# Patient Record
Sex: Male | Born: 2000 | Race: White | Hispanic: No | Marital: Single | Smoking: Never smoker
Health system: Southern US, Community
[De-identification: ages and names within clinical notes are randomized; demographics above are authoritative.]

## PROBLEM LIST (undated history)

## (undated) DIAGNOSIS — R079 Chest pain, unspecified: Secondary | ICD-10-CM

## (undated) DIAGNOSIS — F419 Anxiety disorder, unspecified: Secondary | ICD-10-CM

## (undated) DIAGNOSIS — F32A Depression, unspecified: Secondary | ICD-10-CM

## (undated) DIAGNOSIS — F329 Major depressive disorder, single episode, unspecified: Secondary | ICD-10-CM

## (undated) DIAGNOSIS — Q244 Congenital subaortic stenosis: Secondary | ICD-10-CM

## (undated) HISTORY — DX: Chest pain, unspecified: R07.9

---
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
## 2019-07-26 ENCOUNTER — Ambulatory Visit
Admission: EM | Admit: 2019-07-26 | Discharge: 2019-07-26 | Disposition: A | Discharge status: Home / Self Care | Payer: Medicaid Other

## 2019-07-26 ENCOUNTER — Other Ambulatory Visit: Payer: Self-pay

## 2019-07-26 DIAGNOSIS — R310 Gross hematuria: Secondary | ICD-10-CM | POA: Diagnosis not present

## 2019-07-26 DIAGNOSIS — R109 Unspecified abdominal pain: Secondary | ICD-10-CM

## 2019-07-26 LAB — POCT URINALYSIS DIP (MANUAL ENTRY)
Glucose, UA: NEGATIVE mg/dL
Ketones, POC UA: NEGATIVE mg/dL
Leukocytes, UA: NEGATIVE
Nitrite, UA: NEGATIVE
Protein Ur, POC: 100 mg/dL — AB
Spec Grav, UA: >=1.03 — AB (ref 1.010–1.025)
Urobilinogen, UA: 0.2 E.U./dL
pH, UA: 5.5 (ref 5.0–8.0)

## 2019-07-26 MED ORDER — TAMSULOSIN HCL 0.4 MG PO CAPS: 0.4000 mg | ORAL_CAPSULE | Freq: Every day | ORAL | 0 refills | Status: DC

## 2019-07-26 MED ORDER — KETOROLAC TROMETHAMINE 10 MG PO TABS: 10.0000 mg | ORAL_TABLET | Freq: Four times a day (QID) | ORAL | 0 refills | Status: DC | PRN

## 2019-07-26 NOTE — ED Triage Notes (Signed)
Patient presents with right side pain that started this morning when he woke up.  He noticed some blood in his urine when he went to the bathroom. He denies pain with voiding, but does have a feeling of urgency.

## 2019-07-26 NOTE — Discharge Instructions (Addendum)
Urine negative for infection. Your history and exam is consistent with a kidney stone. Start flomax as directed. Toradol as needed for pain. Keep hydrated, urine should be clear to pale yellow in color. Follow up with PCP/urology if symptoms does not improve/resolve. If having vomiting, fever, unable to urinate, go to the ED for further evaluation needed.

## 2019-07-26 NOTE — ED Provider Notes (Signed)
[facility]    CSN: [id number] Arrival date & time: 07/26/19  1312      History   Chief Complaint Chief Complaint  Patient presents with  . Hematuria    HPI Willie Tucker is a 19 y.o. male.   19 year old male comes in for 1 day history of right flank/RLQ pain, hematuria.  Right flank/RLQ pain is intermittent, radiating to the groin, not specifically associated with urination.  Denies nausea, vomiting, diarrhea.  Denies fever, chills.  Denies dysuria, urinary frequency, urinary urgency.  This noticed slight urinary hesitancy/dribbling.  Denies penile discharge, penile lesion, testicular swelling, testicular pain. Has never been sexually active.      Past Medical History:  Diagnosis Date  . Anxiety   . Depression     There are no problems to display for this patient.   History reviewed. No pertinent surgical history.     Home Medications    Prior to Admission medications   Medication Sig Start Date End Date Taking? Authorizing Provider  ARIPiprazole (ABILIFY) 5 MG tablet Take 5 mg by mouth daily. 06/04/19   [provider]  DULoxetine (CYMBALTA) 60 MG capsule Take 60 mg by mouth daily.    [provider]  ketorolac (TORADOL) 10 MG tablet Take 1 tablet (10 mg total) by mouth every 6 (six) hours as needed. 07/26/19   Cathie Hoops, Taleeya Blondin V, PA-C  tamsulosin (FLOMAX) 0.4 MG CAPS capsule Take 1 capsule (0.4 mg total) by mouth daily. 07/26/19   Belinda Fisher, PA-C    Family History Family History  Problem Relation Age of Onset  . Kidney Stones Mother   . Kidney Stones Father     Social History Social History   Tobacco Use  . Smoking status: Never Smoker  . Smokeless tobacco: Never Used  Substance Use Topics  . Alcohol use: Never  . Drug use: Never     Allergies   Patient has no known allergies.   Review of Systems Review of Systems  Reason unable to perform ROS: See HPI as above.     Physical Exam Triage Vital Signs ED Triage  Vitals  Enc Vitals Group     BP 07/26/19 1322 136/87     Pulse Rate 07/26/19 1322 95     Resp 07/26/19 1322 14     Temp 07/26/19 1322 98.5 F (36.9 C)     Temp Source 07/26/19 1322 Oral     SpO2 07/26/19 1322 98 %     Weight --      Height --      Head Circumference --      Peak Flow --      Pain Score 07/26/19 1324 5     Pain Loc --      Pain Edu? --      Excl. in [place]? --    No data found.  Updated Vital Signs BP 136/87 (BP Location: Left Arm)   Pulse 95   Temp 98.5 F (36.9 C) (Oral)   Resp 14   SpO2 98%   Physical Exam Constitutional:      General: He is not in acute distress.    Appearance: Normal appearance. He is not ill-appearing, toxic-appearing or diaphoretic.  HENT:     Head: Normocephalic and atraumatic.  Cardiovascular:     Rate and Rhythm: Normal rate and regular rhythm.  Pulmonary:     Effort: Pulmonary effort is normal. No respiratory distress.     Comments: LCTAB Abdominal:  General: Bowel sounds are normal.     Palpations: Abdomen is soft.     Tenderness: There is no abdominal tenderness. There is right CVA tenderness. There is no left CVA tenderness, guarding or rebound.  Musculoskeletal:     Cervical back: Normal range of motion and neck supple.  Skin:    General: Skin is warm and dry.  Neurological:     Mental Status: He is alert and oriented to person, place, and time.      UC Treatments / Results  Labs (all labs ordered are listed, but only abnormal results are displayed) Labs Reviewed  POCT URINALYSIS DIP (MANUAL ENTRY) - Abnormal; Notable for the following components:      Result Value   Color, UA red (*)    Clarity, UA cloudy (*)    Bilirubin, UA small (*)    Spec Grav, UA >=1.030 (*)    Blood, UA large (*)    Protein Ur, POC =100 (*)    All other components within normal limits    EKG   Radiology No results found.  Procedures Procedures (including critical care time)  Medications Ordered in UC Medications - No  data to display  Initial Impression / Assessment and Plan / UC Course  I have reviewed the triage vital signs and the nursing notes.  Pertinent labs & imaging results that were available during my care of the patient were reviewed by me and considered in my medical decision making (see chart for details).    Urine dipstick negative for infection.  Positive for large blood, combined with current history and exam consistent with urethral stone.  Patient without significant pain, still able to urinate.  Will treat with Flomax, Toradol.  Patient declined Toradol IM in office at this time.  Push fluids.  Return precautions given.  Patient expresses understanding and agrees to plan.  Final Clinical Impressions(s) / UC Diagnoses   Final diagnoses:  Gross hematuria  Right flank pain    ED Prescriptions    Medication Sig Dispense Auth. Provider   ketorolac (TORADOL) 10 MG tablet Take 1 tablet (10 mg total) by mouth every 6 (six) hours as needed. 20 tablet Garen Woolbright V, PA-C   tamsulosin (FLOMAX) 0.4 MG CAPS capsule Take 1 capsule (0.4 mg total) by mouth daily. 10 capsule Ok Edwards, PA-C     PDMP not reviewed this encounter.   Ok Edwards, PA-C 07/26/19 1349

## 2020-01-17 ENCOUNTER — Ambulatory Visit
Admission: EM | Admit: 2020-01-17 | Discharge: 2020-01-17 | Disposition: A | Discharge status: Home / Self Care | Payer: Medicaid Other | Attending: Physician Assistant | Admitting: Physician Assistant

## 2020-01-17 ENCOUNTER — Other Ambulatory Visit: Payer: Self-pay

## 2020-01-17 DIAGNOSIS — R109 Unspecified abdominal pain: Secondary | ICD-10-CM

## 2020-01-17 DIAGNOSIS — R3129 Other microscopic hematuria: Secondary | ICD-10-CM

## 2020-01-17 LAB — POCT URINALYSIS DIP (MANUAL ENTRY)
Glucose, UA: NEGATIVE mg/dL
Leukocytes, UA: NEGATIVE
Nitrite, UA: NEGATIVE
Spec Grav, UA: 1.025 (ref 1.010–1.025)
Urobilinogen, UA: 1 E.U./dL
pH, UA: 6 (ref 5.0–8.0)

## 2020-01-17 MED ORDER — TIZANIDINE HCL 2 MG PO TABS: 2.0000 mg | ORAL_TABLET | Freq: Three times a day (TID) | ORAL | 0 refills | Status: AC | PRN

## 2020-01-17 MED ORDER — TAMSULOSIN HCL 0.4 MG PO CAPS: 0.4000 mg | ORAL_CAPSULE | Freq: Every day | ORAL | 0 refills | Status: AC

## 2020-01-17 MED ORDER — KETOROLAC TROMETHAMINE 10 MG PO TABS: 10.0000 mg | ORAL_TABLET | Freq: Four times a day (QID) | ORAL | 0 refills | Status: AC | PRN

## 2020-01-17 NOTE — Discharge Instructions (Addendum)
As discussed, there is blood in your urine.  This could be another stone passing through, however, there may also be some muscle involvement.  Start Flomax as directed.  Toradol for pain.  Tizanidine as needed for any muscle pain, this can make you drowsy, so do not take if you are going to drive, operate heavy machinery, or make important decisions. Ice/heat compresses as needed. Follow up with PCP/urology for reevaluation. If unable to urinate, fever, go to the emergency department for further evaluation.

## 2020-01-17 NOTE — ED Triage Notes (Signed)
Patient presents to Urgent Care with complaints of lower left flank pain started a few days ago. Taken Tylenol for pain last dose yesterday.   Denies fever, chills, N/V, hematuria, or abdominal pain.

## 2020-01-17 NOTE — ED Provider Notes (Signed)
[facility]    CSN: [id number] Arrival date & time: 01/17/20  4174      History   Chief Complaint No chief complaint on file.   HPI Willie Tucker is a 19 y.o. male.   19 year old male comes in for left flank pain. Some association with movement. Denies radiation of pain. Denies urinary changes. Denies nausea/vomiting. Denies fever, abdominal pain. Tylenol with some relief.      Past Medical History:  Diagnosis Date  . Anxiety   . Depression     There are no problems to display for this patient.   History reviewed. No pertinent surgical history.     Home Medications    Prior to Admission medications   Medication Sig Start Date End Date Taking? Authorizing Provider  ARIPiprazole (ABILIFY) 5 MG tablet Take 5 mg by mouth daily. 06/04/19   [provider]  DULoxetine (CYMBALTA) 60 MG capsule Take 60 mg by mouth daily.    [provider]  ketorolac (TORADOL) 10 MG tablet Take 1 tablet (10 mg total) by mouth every 6 (six) hours as needed. 01/17/20   Cathie Hoops, Rusell Meneely V, PA-C  tamsulosin (FLOMAX) 0.4 MG CAPS capsule Take 1 capsule (0.4 mg total) by mouth daily. 01/17/20   Cathie Hoops, Rashauna Tep V, PA-C  tiZANidine (ZANAFLEX) 2 MG tablet Take 1 tablet (2 mg total) by mouth every 8 (eight) hours as needed for muscle spasms. 01/17/20   Belinda Fisher, PA-C    Family History Family History  Problem Relation Age of Onset  . Kidney Stones Mother   . Kidney Stones Father     Social History Social History   Tobacco Use  . Smoking status: Never Smoker  . Smokeless tobacco: Never Used  Substance Use Topics  . Alcohol use: Never  . Drug use: Never     Allergies   Patient has no known allergies.   Review of Systems Review of Systems  Reason unable to perform ROS: See HPI as above.     Physical Exam Triage Vital Signs ED Triage Vitals  Enc Vitals Group     BP 01/17/20 1049 (!) 130/94     Pulse Rate 01/17/20 1049 88     Resp 01/17/20 1049 16     Temp  01/17/20 1049 99 F (37.2 C)     Temp Source 01/17/20 1049 Oral     SpO2 01/17/20 1049 96 %     Weight --      Height --      Head Circumference --      Peak Flow --      Pain Score 01/17/20 1048 5     Pain Loc --      Pain Edu? --      Excl. in [place]? --    No data found.  Updated Vital Signs BP (!) 130/94 (BP Location: Left Arm)   Pulse 88   Temp 99 F (37.2 C) (Oral)   Resp 16   SpO2 96%   Physical Exam Constitutional:      General: He is not in acute distress.    Appearance: He is well-developed. He is not diaphoretic.  HENT:     Head: Normocephalic and atraumatic.  Eyes:     Conjunctiva/sclera: Conjunctivae normal.     Pupils: Pupils are equal, round, and reactive to light.  Cardiovascular:     Rate and Rhythm: Normal rate and regular rhythm.     Heart sounds: Normal heart sounds.  No murmur heard.  No friction rub. No gallop.   Pulmonary:     Effort: Pulmonary effort is normal. No accessory muscle usage or respiratory distress.     Breath sounds: Normal breath sounds. No stridor. No decreased breath sounds, wheezing, rhonchi or rales.  Musculoskeletal:     Comments: No tenderness on palpation of the spinous processes. Tenderness to palpation of left flank. Full range of motion of back/hips. Negative straight leg raise. Negative CVA tenderness to palpation.   Skin:    General: Skin is warm and dry.  Neurological:     Mental Status: He is alert and oriented to person, place, and time.      UC Treatments / Results  Labs (all labs ordered are listed, but only abnormal results are displayed) Labs Reviewed  POCT URINALYSIS DIP (MANUAL ENTRY) - Abnormal; Notable for the following components:      Result Value   Bilirubin, UA small (*)    Ketones, POC UA trace (5) (*)    Blood, UA moderate (*)    Protein Ur, POC trace (*)    All other components within normal limits    EKG   Radiology No results found.  Procedures Procedures (including critical care  time)  Medications Ordered in UC Medications - No data to display  Initial Impression / Assessment and Plan / UC Course  I have reviewed the triage vital signs and the nursing notes.  Pertinent labs & imaging results that were available during my care of the patient were reviewed by me and considered in my medical decision making (see chart for details).    ? MSK pain vs urethral stone/renal colic. Given blood on dipstick, will start flomax as directed. Toradol/tizanidine as needed. Patient with gross hematuria last visit from possible urethral stone, no dipstick post symptoms. Discussed to follow up with PCP for dipstick once asymptomatic to ensure resolution of blood. Return precautions given.   Final Clinical Impressions(s) / UC Diagnoses   Final diagnoses:  Left flank pain  Other microscopic hematuria    ED Prescriptions    Medication Sig Dispense Auth. Provider   tamsulosin (FLOMAX) 0.4 MG CAPS capsule Take 1 capsule (0.4 mg total) by mouth daily. 10 capsule Cathie Hoops, Anh Mangano V, PA-C   ketorolac (TORADOL) 10 MG tablet Take 1 tablet (10 mg total) by mouth every 6 (six) hours as needed. 20 tablet Madelaine Whipple V, PA-C   tiZANidine (ZANAFLEX) 2 MG tablet Take 1 tablet (2 mg total) by mouth every 8 (eight) hours as needed for muscle spasms. 15 tablet Belinda Fisher, PA-C     PDMP not reviewed this encounter.   Belinda Fisher, PA-C 01/17/20 1555

## 2021-07-25 ENCOUNTER — Encounter (HOSPITAL_COMMUNITY): Payer: Self-pay

## 2021-07-25 ENCOUNTER — Other Ambulatory Visit: Payer: Self-pay

## 2021-07-25 ENCOUNTER — Emergency Department (HOSPITAL_COMMUNITY)
Admission: EM | Admit: 2021-07-25 | Discharge: 2021-07-25 | Disposition: A | Discharge status: Home / Self Care | Payer: Medicaid Other | Attending: Emergency Medicine | Admitting: Emergency Medicine

## 2021-07-25 ENCOUNTER — Emergency Department (HOSPITAL_COMMUNITY): Payer: Medicaid Other

## 2021-07-25 DIAGNOSIS — R0789 Other chest pain: Secondary | ICD-10-CM | POA: Insufficient documentation

## 2021-07-25 HISTORY — DX: Congenital subaortic stenosis: Q24.4

## 2021-07-25 LAB — BASIC METABOLIC PANEL
Anion gap: 6 (ref 5–15)
BUN: 12 mg/dL (ref 6–20)
CO2: 27 mmol/L (ref 22–32)
Calcium: 9.5 mg/dL (ref 8.9–10.3)
Chloride: 106 mmol/L (ref 98–111)
Creatinine, Ser: 0.78 mg/dL (ref 0.61–1.24)
GFR, Estimated: >60 mL/min (ref >60)
Glucose, Bld: 90 mg/dL (ref 70–99)
Potassium: 4.2 mmol/L (ref 3.5–5.1)
Sodium: 139 mmol/L (ref 135–145)

## 2021-07-25 LAB — CBC
HCT: 50.7 % (ref 39.0–52.0)
Hemoglobin: 17.8 g/dL — ABNORMAL HIGH (ref 13.0–17.0)
MCH: 32.5 pg (ref 26.0–34.0)
MCHC: 35.1 g/dL (ref 30.0–36.0)
MCV: 92.5 fL (ref 80.0–100.0)
Platelets: 256 10*3/uL (ref 150–400)
RBC: 5.48 MIL/uL (ref 4.22–5.81)
RDW: 11.9 % (ref 11.5–15.5)
WBC: 5.4 10*3/uL (ref 4.0–10.5)
nRBC: 0 % (ref 0.0–0.2)

## 2021-07-25 LAB — TROPONIN I (HIGH SENSITIVITY): Troponin I (High Sensitivity): <2 ng/L (ref <18)

## 2021-07-25 MED ORDER — IBUPROFEN 800 MG PO TABS: 800.0000 mg | ORAL_TABLET | Freq: Three times a day (TID) | ORAL | 0 refills | Status: AC | PRN

## 2021-07-25 NOTE — ED Provider Notes (Signed)
?[facility]-EMERGENCY DEPT ?Provider Note ? ? ?CSN: [id number] ?Arrival date & time: 07/25/21  3419 ? ?  ? ?History ? ?Chief Complaint  ?Patient presents with  ? Chest Pain  ? ? ?Willie Tucker is a 21 y.o. male. ? ?Patient complains of left-sided chest pain when he moves his head to the right or he moves his left arm.  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  He was supposed to be followed up ? ?The history is provided by the patient and medical records. No language interpreter was used.  ?Chest Pain ?Pain location:  L chest ?Pain quality: aching   ?Pain radiates to:  Does not radiate ?Pain severity:  Mild ?Onset quality:  Sudden ?Timing:  Constant ?Progression:  Waxing and waning ?Chronicity:  New ?Context: movement   ?Context: not breathing   ?Relieved by:  Nothing ?Worsened by:  Nothing ?Ineffective treatments:  None tried ?Associated symptoms: no abdominal pain, no back pain, no cough, no fatigue and no headache   ? ?  ? ?Home Medications ?Prior to Admission medications   ?Medication Sig Start Date End Date Taking? Authorizing Provider  ?ibuprofen (ADVIL) 800 MG tablet Take 1 tablet (800 mg total) by mouth every 8 (eight) hours as needed for moderate pain. 07/25/21  Yes Bethann Berkshire, MD  ?ARIPiprazole (ABILIFY) 5 MG tablet Take 5 mg by mouth daily. 06/04/19   [provider]  ?DULoxetine (CYMBALTA) 60 MG capsule Take 60 mg by mouth daily.    [provider]  ?ketorolac (TORADOL) 10 MG tablet Take 1 tablet (10 mg total) by mouth every 6 (six) hours as needed. 01/17/20   Cathie Hoops, Amy V, PA-C  ?tamsulosin (FLOMAX) 0.4 MG CAPS capsule Take 1 capsule (0.4 mg total) by mouth daily. 01/17/20   Cathie Hoops, Amy V, PA-C  ?tiZANidine (ZANAFLEX) 2 MG tablet Take 1 tablet (2 mg total) by mouth every 8 (eight) hours as needed for muscle spasms. 01/17/20   Belinda Fisher, PA-C  ?   ? ?Allergies    ?Patient has no known allergies.   ? ?Review of Systems    ?Review of Systems  ?Constitutional:  Negative for appetite change and fatigue.  ?HENT:  Negative for congestion, ear discharge and sinus pressure.   ?Eyes:  Negative for discharge.  ?Respiratory:  Negative for cough.   ?Cardiovascular:  Positive for chest pain.  ?Gastrointestinal:  Negative for abdominal pain and diarrhea.  ?Genitourinary:  Negative for frequency and hematuria.  ?Musculoskeletal:  Negative for back pain.  ?Skin:  Negative for rash.  ?Neurological:  Negative for seizures and headaches.  ?Psychiatric/Behavioral:  Negative for hallucinations.   ? ?Physical Exam ?Updated Vital Signs ?BP 118/72   Pulse 83   Temp 98.1 ?F (36.7 ?C) (Oral)   Resp 20   Ht 5\' 10"  (1.778 m)   Wt 86.2 kg   SpO2 96%   BMI 27.26 kg/m?  ?Physical Exam ?Vitals and nursing note reviewed.  ?Constitutional:   ?   Appearance: He is well-developed.  ?HENT:  ?   Head: Normocephalic.  ?   Nose: Nose normal.  ?Eyes:  ?   General: No scleral icterus. ?   Conjunctiva/sclera: Conjunctivae normal.  ?Neck:  ?   Thyroid: No thyromegaly.  ?Cardiovascular:  ?   Rate and Rhythm: Normal rate and regular rhythm.  ?   Heart sounds: No murmur heard. ?  No friction rub. No gallop.  ?  Pulmonary:  ?   Breath sounds: No stridor. No wheezing or rales.  ?Chest:  ?   Chest wall: Tenderness present.  ?Abdominal:  ?   General: There is no distension.  ?   Tenderness: There is no abdominal tenderness. There is no rebound.  ?Musculoskeletal:     ?   General: Normal range of motion.  ?   Cervical back: Neck supple.  ?Lymphadenopathy:  ?   Cervical: No cervical adenopathy.  ?Skin: ?   Findings: No erythema or rash.  ?Neurological:  ?   Mental Status: He is alert and oriented to person, place, and time.  ?   Motor: No abnormal muscle tone.  ?   Coordination: Coordination normal.  ?Psychiatric:     ?   Behavior: Behavior normal.  ? ? ?ED Results / Procedures / Treatments   ?Labs ?(all labs ordered are listed, but only abnormal results are displayed) ?Labs  Reviewed  ?CBC - Abnormal; Notable for the following components:  ?    Result Value  ? Hemoglobin 17.8 (*)   ? All other components within normal limits  ?BASIC METABOLIC PANEL  ?TROPONIN I (HIGH SENSITIVITY)  ?TROPONIN I (HIGH SENSITIVITY)  ? ? ?EKG ?EKG Interpretation ? ?Date/Time:  Tuesday Jul 25 2021 09:07:54 EDT ?Ventricular Rate:  89 ?PR Interval:  131 ?QRS Duration: 93 ?QT Interval:  356 ?QTC Calculation: 434 ?R Axis:   76 ?Text Interpretation: Sinus rhythm Confirmed by Bethann BerkshireZammit, Ravon Mortellaro [phone]([id number]) on 07/25/2021 9:15:30 AM ? ?Radiology ?DG Chest 2 View ? ?Result Date: 07/25/2021 ?CLINICAL DATA:  Chest pain. EXAM: CHEST - 2 VIEW COMPARISON:  Chest x-ray January 03, 2009 (without report). FINDINGS: No consolidation. No visible pleural effusions or pneumothorax. Cardiomediastinal silhouette is within normal limits. No displaced fracture. IMPRESSION: No evidence of acute cardiopulmonary disease. Electronically Signed   By: Feliberto HartsFrederick S Jones M.D.   On: 07/25/2021 09:33   ? ?Procedures ?Procedures  ? ? ?Medications Ordered in ED ?Medications - No data to display ? ?ED Course/ Medical Decision Making/ A&P ?Patient has atypical chest pain.  Doubt cardiac reason for pain. ?                        ?Medical Decision Making ?Amount and/or Complexity of Data Reviewed ?Labs: ordered. ?Radiology: ordered. ? ?Risk ?Prescription drug management. ? ?This patient presents to the ED for concern of chest pain, this involves an extensive number of treatment options, and is a complaint that carries with it a high risk of complications and morbidity.  The differential diagnosis includes musculoskeletal pain, cardiac pain, PE, pneumothorax ? ? ?Co morbidities that complicate the patient evaluation ? ?History of Sub aortic stenosis ? ? ?Additional history obtained: ? ?Additional history obtained from patient ?External records from outside source obtained and reviewed including hospital record ? ? ?Lab Tests: ? ?I Ordered, and personally  interpreted labs.  The pertinent results include: CBC chemistries troponin negative ? ? ?Imaging Studies ordered: ? ?I ordered imaging studies including chest X ?I independently visualized and interpreted imaging which showed unremarkable ?I agree with the radiologist interpretation ? ? ?Cardiac Monitoring: / EKG: ? ?The patient was maintained on a cardiac monitor.  I personally viewed and interpreted the cardiac monitored which showed an underlying rhythm of: Normal sinus rhythm ? ? ?Consultations Obtained: ? ?I requested consultation with the cardiology,  and discussed lab and imaging findings as well as pertinent plan - they recommend: Follow-up in the office ? ? ?  Problem List / ED Course / Critical interventions / Medication management ? ?Chest pain, subaortic stenosis ?I ordered medication including Motrin for pain ?Reevaluation of the patient after these medicines showed that the patient stayed the same ?I have reviewed the patients home medicines and have made adjustments as needed ? ? ?Social Determinants of Health: ? ?None ? ? ?Test / Admission - Considered: ? ?Echo ? ?Patient with atypical chest pain most likely related to musculoskeletal.  He will also follow-up with cardiology for his subaortic stenosis ? ? ? ? ? ? ? ?Final Clinical Impression(s) / ED Diagnoses ?Final diagnoses:  ?Atypical chest pain  ? ? ?Rx / DC Orders ?ED Discharge Orders   ? ?      Ordered  ?  ibuprofen (ADVIL) 800 MG tablet  Every 8 hours PRN       ? 07/25/21 1403  ? ?  ?  ? ?  ? ? ?  ?Bethann Berkshire, MD ?07/25/21 1642 ? ?

## 2021-07-25 NOTE — ED Triage Notes (Signed)
Patient states constant mid chest tightness since yesterday at 0700. Patient reports slight SOB. Patient denies N/V and diaphoresis. ?

## 2021-07-25 NOTE — Discharge Instructions (Signed)
Follow-up with your cardiologist when they get in contact you with you.  You can also follow-up with your family doctor if needed ?

## 2021-07-29 NOTE — Progress Notes (Unsigned)
Cardiology Office Note:    Date:  07/29/2021   ID:  Willie Tucker, DOB 2000/10/29, MRN [id number]  PCP:  Patient, No Pcp Per (Inactive)   [facility] Providers Cardiologist:  None {  Referring MD: No ref. provider found    History of Present Illness:    Willie Tucker is a 21 y.o. male with a hx of anxiety, depression and subaortic stenosis who was referred by Dr. Roderic Palau for further evaluation subaortic stenosis.  Patient was seen in the ER on 07/25/21 for atypical left sided chest pain that was worsened with certain positions of the arm. Trop negative. ECG nonischemic. CXR unremarkable. It was thought to be MSK in nature. Patient, however, was noted to have history of subaortic stenosis that has not been followed prompting referral to Cardiology today.  Today, ***  Past Medical History:  Diagnosis Date   Anxiety    Chest pain    Depression    Subaortic stenosis     No past surgical history on file.  Current Medications: No outpatient medications have been marked as taking for the 08/02/21 encounter (Appointment) with Freada Bergeron, MD.     Allergies:   Patient has no known allergies.   Social History   Socioeconomic History   Marital status: Single    Spouse name: Not on file   Number of children: Not on file   Years of education: Not on file   Highest education level: Not on file  Occupational History   Not on file  Tobacco Use   Smoking status: Never   Smokeless tobacco: Never  Vaping Use   Vaping Use: Never used  Substance and Sexual Activity   Alcohol use: Never   Drug use: Never   Sexual activity: Not on file  Other Topics Concern   Not on file  Social History Narrative   Not on file   Social Determinants of Health   Financial Resource Strain: Not on file  Food Insecurity: Not on file  Transportation Needs: Not on file  Physical Activity: Not on file  Stress: Not on file  Social Connections: Not on file     Family  History: The patient's ***family history includes Kidney Stones in his father and mother.  ROS:   Please see the history of present illness.    *** All other systems reviewed and are negative.  EKGs/Labs/Other Studies Reviewed:    The following studies were reviewed today: ***  EKG:  EKG is *** ordered today.  The ekg ordered today demonstrates ***  Recent Labs: 07/25/2021: BUN 12; Creatinine, Ser 0.78; Hemoglobin 17.8; Platelets 256; Potassium 4.2; Sodium 139  Recent Lipid Panel No results found for: CHOL, TRIG, HDL, CHOLHDL, VLDL, LDLCALC, LDLDIRECT   Risk Assessment/Calculations:   {Does this patient have ATRIAL FIBRILLATION?:[phone]}       Physical Exam:    VS:  There were no vitals taken for this visit.    Wt Readings from Last 3 Encounters:  07/25/21 190 lb (86.2 kg)     GEN: *** Well nourished, well developed in no acute distress HEENT: Normal NECK: No JVD; No carotid bruits LYMPHATICS: No lymphadenopathy CARDIAC: ***RRR, no murmurs, rubs, gallops RESPIRATORY:  Clear to auscultation without rales, wheezing or rhonchi  ABDOMEN: Soft, non-tender, non-distended MUSCULOSKELETAL:  No edema; No deformity  SKIN: Warm and dry NEUROLOGIC:  Alert and oriented x 3 PSYCHIATRIC:  Normal affect   ASSESSMENT:    No diagnosis found. PLAN:    In  order of problems listed above:  #History of Subaortic Stenosis: -Check TTE      {Are you ordering a CV Procedure (e.g. stress test, cath, DCCV, TEE, etc)?   Press F2        :[id number]    Medication Adjustments/Labs and Tests Ordered: Current medicines are reviewed at length with the patient today.  Concerns regarding medicines are outlined above.  No orders of the defined types were placed in this encounter.  No orders of the defined types were placed in this encounter.   There are no Patient Instructions on file for this visit.   Signed, Freada Bergeron, MD  07/29/2021 4:08 PM    [facility]

## 2021-08-02 ENCOUNTER — Ambulatory Visit: Payer: Medicaid Other | Admitting: Cardiology

## 2021-08-02 NOTE — Progress Notes (Incomplete)
Cardiology Office Note:    Date:  08/02/2021   ID:  Willie Tucker, DOB June 04, 2000, MRN [id number]  PCP:  Patient, No Pcp Per (Inactive)   [facility] Providers Cardiologist:  None {  Referring MD: No ref. provider found    History of Present Illness:    Willie Tucker is a 21 y.o. male with a hx of anxiety, depression and subaortic stenosis who was referred by Dr. Estell Harpin for further evaluation subaortic stenosis.  Patient was seen in the ER on 07/25/21 for atypical left sided chest pain that was worsened with certain positions of the arm. Trop negative. ECG nonischemic. CXR unremarkable. It was thought to be MSK in nature. Patient, however, was noted to have history of subaortic stenosis that has not been followed prompting referral to Cardiology today.  Today, the patient states that ***  He denies any palpitations, chest pain, shortness of breath, or peripheral edema. No lightheadedness, headaches, syncope, orthopnea, or PND.   Past Medical History:  Diagnosis Date   Anxiety    Chest pain    Depression    Subaortic stenosis     No past surgical history on file.  Current Medications: No outpatient medications have been marked as taking for the 08/02/21 encounter (Appointment) with Meriam Sprague, MD.     Allergies:   Patient has no known allergies.   Social History   Socioeconomic History   Marital status: Single    Spouse name: Not on file   Number of children: Not on file   Years of education: Not on file   Highest education level: Not on file  Occupational History   Not on file  Tobacco Use   Smoking status: Never   Smokeless tobacco: Never  Vaping Use   Vaping Use: Never used  Substance and Sexual Activity   Alcohol use: Never   Drug use: Never   Sexual activity: Not on file  Other Topics Concern   Not on file  Social History Narrative   Not on file   Social Determinants of Health   Financial Resource Strain: Not on file  Food  Insecurity: Not on file  Transportation Needs: Not on file  Physical Activity: Not on file  Stress: Not on file  Social Connections: Not on file     Family History: The patient's family history includes Kidney Stones in his father and mother.  ROS:   Review of Systems  Constitutional:  Negative for chills and fever.  HENT:  Negative for hearing loss and sinus pain.   Eyes:  Negative for photophobia.  Respiratory:  Negative for hemoptysis and sputum production.   Cardiovascular:  Negative for chest pain, palpitations, orthopnea, claudication, leg swelling and PND.  Gastrointestinal:  Negative for abdominal pain and constipation.  Genitourinary:  Negative for hematuria.  Musculoskeletal:  Negative for neck pain.  Neurological:  Negative for speech change and loss of consciousness.  Endo/Heme/Allergies:  Does not bruise/bleed easily.  Psychiatric/Behavioral:  Negative for hallucinations and suicidal ideas.     EKGs/Labs/Other Studies Reviewed:    The following studies were reviewed today:  No prior cardiovascular studies available.   EKG:  EKG is personally reviewed. 08/02/2021:  EKG was not ordered. 07/25/2021 (ED):  Sinus rhythm at 89 bpm.  Recent Labs: 07/25/2021: BUN 12; Creatinine, Ser 0.78; Hemoglobin 17.8; Platelets 256; Potassium 4.2; Sodium 139   Recent Lipid Panel No results found for: CHOL, TRIG, HDL, CHOLHDL, VLDL, LDLCALC, LDLDIRECT   Risk Assessment/Calculations:   {  Does this patient have ATRIAL FIBRILLATION?:[phone]}       Physical Exam:    VS:  There were no vitals taken for this visit.    Wt Readings from Last 3 Encounters:  07/25/21 190 lb (86.2 kg)     GEN: Well nourished, well developed in no acute distress HEENT: Normal NECK: No JVD; No carotid bruits LYMPHATICS: No lymphadenopathy CARDIAC: ***RRR, no murmurs, rubs, gallops RESPIRATORY:  Clear to auscultation without rales, wheezing or rhonchi  ABDOMEN: Soft, non-tender,  non-distended MUSCULOSKELETAL:  No edema; No deformity  SKIN: Warm and dry NEUROLOGIC:  Alert and oriented x 3 PSYCHIATRIC:  Normal affect   ASSESSMENT:    No diagnosis found. PLAN:    In order of problems listed above:  #History of Subaortic Stenosis: -Check TTE      {Are you ordering a CV Procedure (e.g. stress test, cath, DCCV, TEE, etc)?   Press F2        :[id number]}  Follow-up:  ***   Medication Adjustments/Labs and Tests Ordered: Current medicines are reviewed at length with the patient today.  Concerns regarding medicines are outlined above.   No orders of the defined types were placed in this encounter.  No orders of the defined types were placed in this encounter.  There are no Patient Instructions on file for this visit.   I,Mathew Stumpf,acting as a Neurosurgeon for Meriam Sprague, MD.,have documented all relevant documentation on the behalf of Meriam Sprague, MD,as directed by  Meriam Sprague, MD while in the presence of Meriam Sprague, MD.  ***  Signed, Carlena Bjornstad  08/02/2021 7:56 AM    [facility]

## 2021-09-22 ENCOUNTER — Encounter (HOSPITAL_COMMUNITY): Payer: Self-pay

## 2021-09-22 ENCOUNTER — Other Ambulatory Visit: Payer: Self-pay

## 2021-09-22 ENCOUNTER — Emergency Department (HOSPITAL_COMMUNITY)
Admission: EM | Admit: 2021-09-22 | Discharge: 2021-09-22 | Disposition: A | Discharge status: Home / Self Care | Payer: Medicaid Other | Attending: Emergency Medicine | Admitting: Emergency Medicine

## 2021-09-22 DIAGNOSIS — R42 Dizziness and giddiness: Secondary | ICD-10-CM | POA: Diagnosis not present

## 2021-09-22 DIAGNOSIS — R002 Palpitations: Secondary | ICD-10-CM | POA: Insufficient documentation

## 2021-09-22 DIAGNOSIS — R079 Chest pain, unspecified: Secondary | ICD-10-CM | POA: Insufficient documentation

## 2021-09-22 LAB — CBC WITH DIFFERENTIAL/PLATELET
Abs Immature Granulocytes: 0.01 10*3/uL (ref 0.00–0.07)
Basophils Absolute: 0 10*3/uL (ref 0.0–0.1)
Basophils Relative: 0 %
Eosinophils Absolute: 0 10*3/uL (ref 0.0–0.5)
Eosinophils Relative: 1 %
HCT: 46.7 % (ref 39.0–52.0)
Hemoglobin: 16.4 g/dL (ref 13.0–17.0)
Immature Granulocytes: 0 %
Lymphocytes Relative: 24 %
Lymphs Abs: 1.4 10*3/uL (ref 0.7–4.0)
MCH: 31.8 pg (ref 26.0–34.0)
MCHC: 35.1 g/dL (ref 30.0–36.0)
MCV: 90.5 fL (ref 80.0–100.0)
Monocytes Absolute: 0.4 10*3/uL (ref 0.1–1.0)
Monocytes Relative: 7 %
Neutro Abs: 3.8 10*3/uL (ref 1.7–7.7)
Neutrophils Relative %: 68 %
Platelets: 252 10*3/uL (ref 150–400)
RBC: 5.16 MIL/uL (ref 4.22–5.81)
RDW: 11.9 % (ref 11.5–15.5)
WBC: 5.6 10*3/uL (ref 4.0–10.5)
nRBC: 0 % (ref 0.0–0.2)

## 2021-09-22 LAB — COMPREHENSIVE METABOLIC PANEL
ALT: 18 U/L (ref 0–44)
AST: 23 U/L (ref 15–41)
Albumin: 4.4 g/dL (ref 3.5–5.0)
Alkaline Phosphatase: 80 U/L (ref 38–126)
Anion gap: 10 (ref 5–15)
BUN: 8 mg/dL (ref 6–20)
CO2: 24 mmol/L (ref 22–32)
Calcium: 9.1 mg/dL (ref 8.9–10.3)
Chloride: 100 mmol/L (ref 98–111)
Creatinine, Ser: 0.74 mg/dL (ref 0.61–1.24)
GFR, Estimated: >60 mL/min (ref >60)
Glucose, Bld: 109 mg/dL — ABNORMAL HIGH (ref 70–99)
Potassium: 3.1 mmol/L — ABNORMAL LOW (ref 3.5–5.1)
Sodium: 134 mmol/L — ABNORMAL LOW (ref 135–145)
Total Bilirubin: 1.2 mg/dL (ref 0.3–1.2)
Total Protein: 7.3 g/dL (ref 6.5–8.1)

## 2021-09-22 LAB — MAGNESIUM: Magnesium: 1.8 mg/dL (ref 1.7–2.4)

## 2021-09-22 LAB — TROPONIN I (HIGH SENSITIVITY): Troponin I (High Sensitivity): 4 ng/L (ref <18)

## 2021-09-22 LAB — RAPID URINE DRUG SCREEN, HOSP PERFORMED
Amphetamines: NOT DETECTED
Barbiturates: NOT DETECTED
Benzodiazepines: NOT DETECTED
Cocaine: NOT DETECTED
Opiates: NOT DETECTED
Tetrahydrocannabinol: NOT DETECTED

## 2021-09-22 MED ORDER — POTASSIUM CHLORIDE 10 MEQ/100ML IV SOLN
10.0000 meq | Freq: Once | INTRAVENOUS | Status: AC
  Administered 2021-09-22: 10 meq via INTRAVENOUS
  Filled 2021-09-22: qty 100

## 2021-09-22 MED ORDER — POTASSIUM CHLORIDE CRYS ER 20 MEQ PO TBCR: 20.0000 meq | EXTENDED_RELEASE_TABLET | Freq: Every day | ORAL | 0 refills | Status: AC

## 2021-09-22 MED ORDER — LORAZEPAM 1 MG PO TABS
1.0000 mg | ORAL_TABLET | Freq: Once | ORAL | Status: AC
  Administered 2021-09-22: 1 mg via ORAL
  Filled 2021-09-22: qty 1

## 2021-09-22 MED ORDER — POTASSIUM CHLORIDE CRYS ER 20 MEQ PO TBCR
40.0000 meq | EXTENDED_RELEASE_TABLET | Freq: Once | ORAL | Status: AC
  Administered 2021-09-22: 40 meq via ORAL
  Filled 2021-09-22: qty 2

## 2021-09-22 NOTE — Discharge Instructions (Addendum)
Take your potassium supplement for the next 5 days.  As stated your pharmacy.  I have also attached information regarding potassium rich foods.  Please avoid all forms of caffeine given your current emergency department visit.  Follow-up with your PCP in 3 to 5 days for reevaluation.  Please do not hesitate to return to the emergency department if the worrisome signs and symptoms we discussed become apparent.

## 2021-09-22 NOTE — ED Triage Notes (Signed)
Patient reports that he has had energy drinks and coffee this AM. Patient c/o heart palpitations, lightheadedness, chest tightness, and feels like his hands are falling asleep.

## 2021-09-22 NOTE — ED Provider Notes (Signed)
[facility]-EMERGENCY DEPT Provider Note   CSN: [id number] Arrival date & time: 09/22/21  4496     History  Chief Complaint  Patient presents with   heart palpitaions   light headedness   Chest Pain    Willie Tucker is a 21 y.o. male.   Chest Pain Associated symptoms: palpitations   Associated symptoms: no abdominal pain, no back pain, no cough, no fever, no shortness of breath and no vomiting    21 year old male presents emergency department with complaints of chest pain.  Patient states that he had 1 Monster energy drink last night.  He woke up this morning and had another Monster energy drink followed by a cup of coffee.  While he was drinking his cup of coffee around 7 AM, he began to have palpitations, lightheadedness, chest tightness.  Symptoms have persisted since onset.  Patient states that he usually drinks coffee every morning but only occasionally has 1 Monster energy drink.  Patient denies symptoms similar in the past.  Denies drug use.  Reports taking at home medicine as prescribed. Denies shortness of breath, abdominal pain, nausea/vomiting/diarrhea, urinary symptoms, change in bowel habits, fever, chills, night sweats.  Home Medications Prior to Admission medications   Medication Sig Start Date End Date Taking? Authorizing Provider  potassium chloride SA (KLOR-CON M) 20 MEQ tablet Take 1 tablet (20 mEq total) by mouth daily. 09/22/21  Yes Sherian Maroon A, PA  ARIPiprazole (ABILIFY) 5 MG tablet Take 5 mg by mouth daily. 06/04/19   [provider]  DULoxetine (CYMBALTA) 60 MG capsule Take 60 mg by mouth daily.    [provider]  ibuprofen (ADVIL) 800 MG tablet Take 1 tablet (800 mg total) by mouth every 8 (eight) hours as needed for moderate pain. 07/25/21   Bethann Berkshire, MD  ketorolac (TORADOL) 10 MG tablet Take 1 tablet (10 mg total) by mouth every 6 (six) hours as needed. 01/17/20   Cathie Hoops, Amy V, PA-C  tamsulosin (FLOMAX) 0.4  MG CAPS capsule Take 1 capsule (0.4 mg total) by mouth daily. 01/17/20   Cathie Hoops, Amy V, PA-C  tiZANidine (ZANAFLEX) 2 MG tablet Take 1 tablet (2 mg total) by mouth every 8 (eight) hours as needed for muscle spasms. 01/17/20   Belinda Fisher, PA-C      Allergies    Patient has no known allergies.    Review of Systems   Review of Systems  Constitutional:  Negative for chills and fever.  HENT:  Negative for ear pain and sore throat.   Eyes:  Negative for pain and visual disturbance.  Respiratory:  Negative for cough and shortness of breath.   Cardiovascular:  Positive for chest pain and palpitations.  Gastrointestinal:  Negative for abdominal pain and vomiting.  Genitourinary:  Negative for dysuria and hematuria.  Musculoskeletal:  Negative for arthralgias and back pain.  Skin:  Negative for color change and rash.  Neurological:  Negative for seizures and syncope.  All other systems reviewed and are negative.   Physical Exam Updated Vital Signs BP (!) 150/81   Pulse 83   Temp 97.8 F (36.6 C) (Oral)   Resp 17   Ht 5\' 10"  (1.778 m)   Wt 81.6 kg   SpO2 99%   BMI 25.83 kg/m  Physical Exam Vitals and nursing note reviewed.  Constitutional:      General: He is not in acute distress.    Appearance: He is well-developed.  HENT:  Head: Normocephalic and atraumatic.  Eyes:     Extraocular Movements: Extraocular movements intact.     Conjunctiva/sclera: Conjunctivae normal.  Neck:     Vascular: No JVD.  Cardiovascular:     Rate and Rhythm: Regular rhythm. Tachycardia present.     Heart sounds: Murmur heard.     Systolic murmur is present.     Comments: Systolic murmur noted right upper sternal border.  Radial and posterior tibial pulses full and intact bilaterally.  Pulmonary:     Effort: Pulmonary effort is normal. No respiratory distress.     Breath sounds: Normal breath sounds.  Abdominal:     Palpations: Abdomen is soft.     Tenderness: There is no abdominal tenderness.   Musculoskeletal:        General: No swelling.     Cervical back: Normal range of motion and neck supple.     Right lower leg: No edema.     Left lower leg: No edema.  Skin:    General: Skin is warm and dry.     Capillary Refill: Capillary refill takes less than 2 seconds.  Neurological:     Mental Status: He is alert.  Psychiatric:        Mood and Affect: Mood normal.     ED Results / Procedures / Treatments   Labs (all labs ordered are listed, but only abnormal results are displayed) Labs Reviewed  COMPREHENSIVE METABOLIC PANEL - Abnormal; Notable for the following components:      Result Value   Sodium 134 (*)    Potassium 3.1 (*)    Glucose, Bld 109 (*)    All other components within normal limits  CBC WITH DIFFERENTIAL/PLATELET  RAPID URINE DRUG SCREEN, HOSP PERFORMED  MAGNESIUM  TROPONIN I (HIGH SENSITIVITY)    EKG EKG Interpretation  Date/Time:  Friday September 22 2021 09:36:20 EDT Ventricular Rate:  115 PR Interval:  147 QRS Duration: 102 QT Interval:  349 QTC Calculation: 483 R Axis:   70 Text Interpretation: Sinus tachycardia Probable left atrial enlargement Nonspecific repol abnormality, diffuse leads Borderline prolonged QT interval Confirmed by Margarita Grizzle [phone]) on 09/22/2021 10:00:43 AM  Radiology No results found.  Procedures Procedures    Medications Ordered in ED Medications  potassium chloride 10 mEq in 100 mL IVPB (10 mEq Intravenous New Bag/Given 09/22/21 1113)  LORazepam (ATIVAN) tablet 1 mg (1 mg Oral Given 09/22/21 1020)  potassium chloride SA (KLOR-CON M) CR tablet 40 mEq (40 mEq Oral Given 09/22/21 1113)    ED Course/ Medical Decision Making/ A&P Clinical Course as of 09/22/21 1208  Fri Sep 22, 2021  1200 Reevaluation of the patient showed resolution of symptoms.  Patient lying comfortably on exam in bed. [CR]    Clinical Course User Index [CR] Peter Garter, PA                           Medical Decision Making Amount and/or  Complexity of Data Reviewed Labs: ordered.  Risk Prescription drug management.   This patient presents to the ED for concern of caffeine use/chest pain, this involves an extensive number of treatment options, and is a complaint that carries with it a high risk of complications and morbidity.  The differential diagnosis includes The emergent causes of chest pain include: Acute coronary syndrome, tamponade, pericarditis/myocarditis, aortic dissection, pulmonary embolism, tension pneumothorax, pneumonia, and esophageal rupture. I do not believe the patient has an emergent cause of  chest pain, other urgent/non-acute considerations include, but are not limited to: chronic angina, aortic stenosis, cardiomyopathy, mitral valve prolapse, pulmonary hypertension, aortic insufficiency, right ventricular hypertrophy, pleuritis, bronchitis, pneumothorax, tumor, gastroesophageal reflux disease (GERD), esophageal spasm, Mallory-Weiss syndrome, peptic ulcer disease, pancreatitis, functional gastrointestinal pain, cervical or thoracic disk disease or arthritis, shoulder arthritis, costochondritis, subacromial bursitis, anxiety or panic attack, herpes zoster, breast disorders, chest wall tumors, thoracic outlet syndrome, mediastinitis.Caffeine use   Co morbidities that complicate the patient evaluation  Anxiety, chest pain, depression, subaortic stenosis   Additional history obtained:  Additional history obtained from previous troponin from 07/25/2021 External records from outside source obtained and reviewed including less than 2   Lab Tests:  I Ordered, and personally interpreted labs.  The pertinent results include: Potassium 3.1.   Imaging Studies ordered:  N/a   Cardiac Monitoring: / EKG:  The patient was maintained on a cardiac monitor.  I personally viewed and interpreted the cardiac monitored which showed an underlying rhythm of: Sinus tachycardia with borderline QT  prolongation.   Consultations Obtained:  N/a  Problem List / ED Course / Critical interventions / Medication management  Caffeine use I ordered medication including Ativan  Reevaluation of the patient after these medicines showed that the patient improved I have reviewed the patients home medicines and have made adjustments as needed   Social Determinants of Health:  Denies alcohol, illicit drug use.   Test / Admission - Considered:  Palpitations Vitals signs significant for tachycardia initially with rate of 120 as well as tachypneic with a respiratory rate of 22 and blood pressure 146/84.  Heart rate and respiratory rate responded well to Ativan given.. Otherwise within normal range and stable throughout visit. Laboratory/imaging studies significant for: See above.  Patient's hypokalemia corrected while in emergency department with IV and oral potassium.   Patient severe likely secondary to excessive caffeine intake.  Patient advised to avoid excessive caffeine intake such as what precipitated his symptoms today. Doubt ACS given a concerning history, lack of risk factors, negative initial troponin, age.  Doubt PE given resolution of tachycardia/symptoms with Ativan as well as no associated shortness of breath.  Patient not able to pass PERC criteria but given history of PE deemed unlikely.  Doubt dissection.  Doubt pericarditis/myocarditis.  Worrisome signs and symptoms were discussed with the patient, and the patient acknowledged understanding to return to the ED if noticed. Patient was stable upon discharge.         Final Clinical Impression(s) / ED Diagnoses Final diagnoses:  Palpitations    Rx / DC Orders ED Discharge Orders          Ordered    potassium chloride SA (KLOR-CON M) 20 MEQ tablet  Daily        09/22/21 1205              Peter Garter, [place] 09/22/21 1209    Margarita Grizzle, MD 09/25/21 1730

## 2023-07-15 IMAGING — CR DG CHEST 2V
2 series · 2 of 2 positions shown · non-contrast
Comparison: Chest x-ray January 03, 2009 (without report).

CLINICAL DATA: Chest pain.

EXAM:
CHEST - 2 VIEW

[w chest pa]
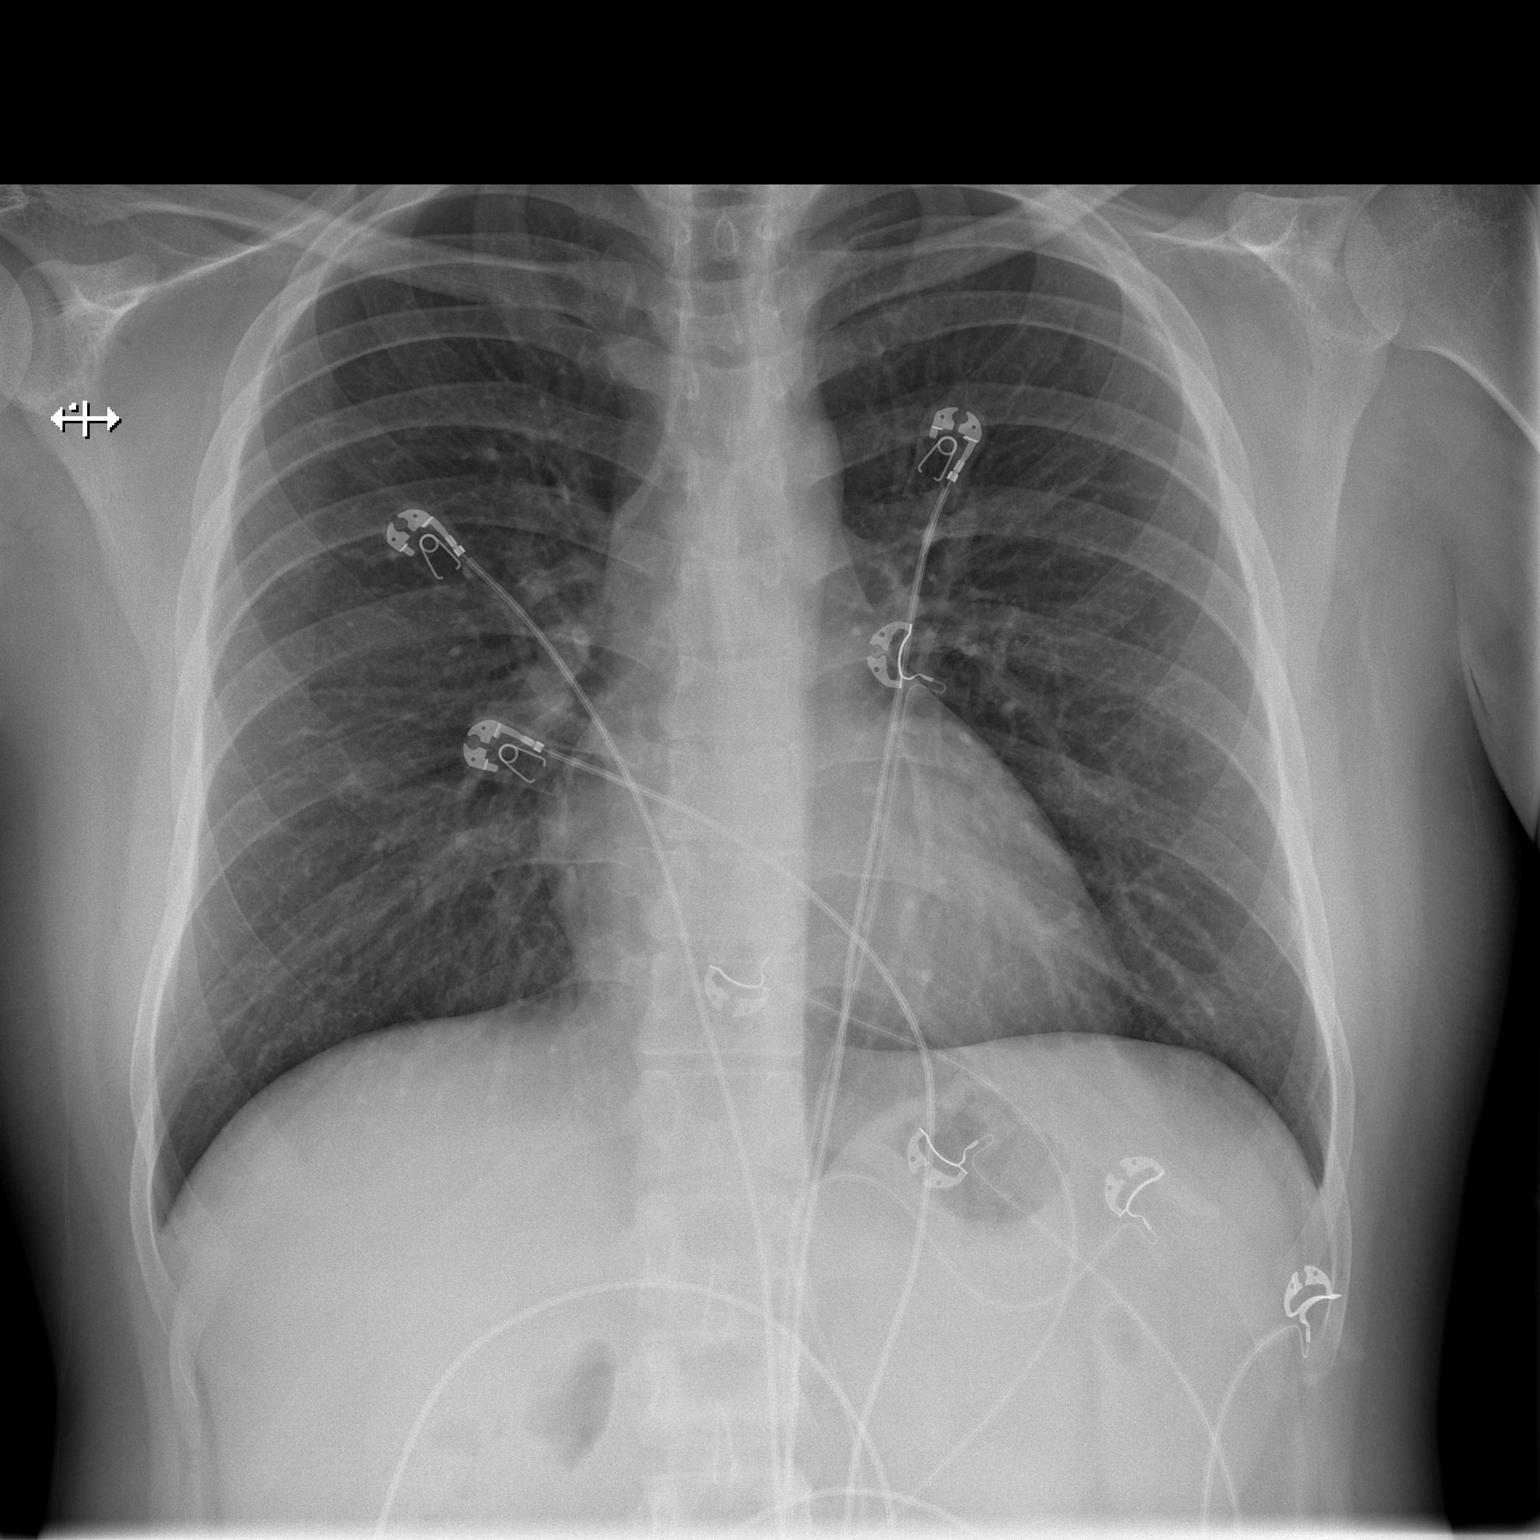

[w chest lat]
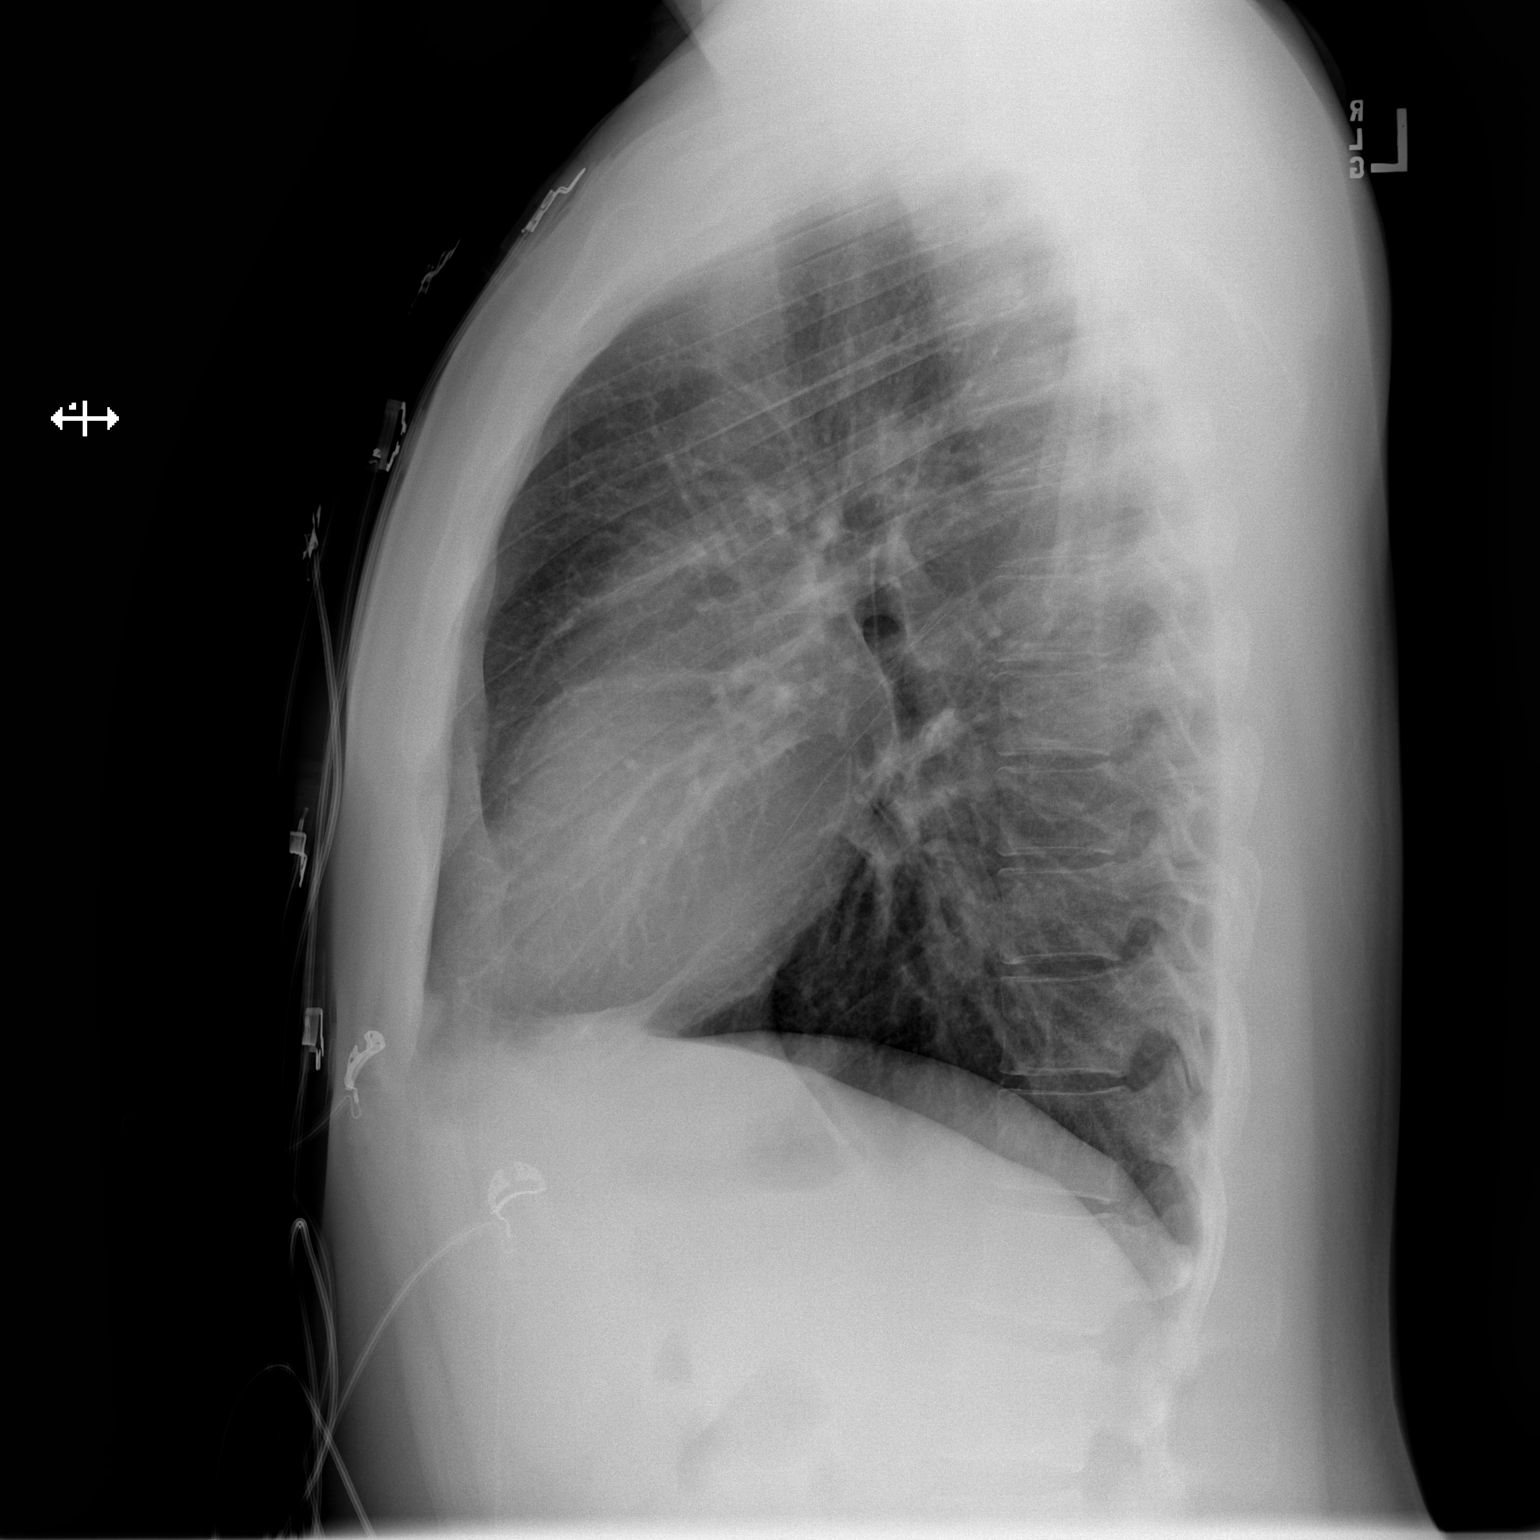

[2 of 2 positions shown; findings below may reference images not displayed]

FINDINGS: No consolidation. No visible pleural effusions or pneumothorax.
Cardiomediastinal silhouette is within normal limits. No displaced
fracture.
IMPRESSION: No evidence of acute cardiopulmonary disease.
